# Patient Record
Sex: Male | Born: 1995 | Race: White | Hispanic: No | Marital: Single | Smoking: Never smoker
Health system: Southern US, Community
[De-identification: ages and names within clinical notes are randomized; demographics above are authoritative.]

## PROBLEM LIST (undated history)

## (undated) DIAGNOSIS — J302 Other seasonal allergic rhinitis: Secondary | ICD-10-CM

## (undated) DIAGNOSIS — I493 Ventricular premature depolarization: Secondary | ICD-10-CM

## (undated) HISTORY — PX: CIRCUMCISION: SUR203

---
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
## 2016-11-09 ENCOUNTER — Emergency Department (INDEPENDENT_AMBULATORY_CARE_PROVIDER_SITE_OTHER)
Admission: EM | Admit: 2016-11-09 | Discharge: 2016-11-09 | Disposition: A | Discharge status: Home / Self Care | Payer: BLUE CROSS/BLUE SHIELD | Source: Home / Self Care | Attending: Family Medicine | Admitting: Family Medicine

## 2016-11-09 DIAGNOSIS — J039 Acute tonsillitis, unspecified: Secondary | ICD-10-CM | POA: Diagnosis not present

## 2016-11-09 HISTORY — DX: Ventricular premature depolarization: I49.3

## 2016-11-09 LAB — POCT INFLUENZA A/B
INFLUENZA B, POC: NEGATIVE
Influenza A, POC: NEGATIVE

## 2016-11-09 LAB — POCT RAPID STREP A (OFFICE): Rapid Strep A Screen: NEGATIVE

## 2016-11-09 MED ORDER — METHYLPREDNISOLONE SODIUM SUCC 125 MG IJ SOLR
80.0000 mg | Freq: Once | INTRAMUSCULAR | Status: AC
  Administered 2016-11-09: 80 mg via INTRAMUSCULAR

## 2016-11-09 MED ORDER — CLINDAMYCIN HCL 300 MG PO CAPS: 300.0000 mg | ORAL_CAPSULE | Freq: Three times a day (TID) | ORAL | 0 refills | Status: AC

## 2016-11-09 MED ORDER — PREDNISONE 20 MG PO TABS: ORAL_TABLET | ORAL | 0 refills | Status: AC

## 2016-11-09 NOTE — ED Triage Notes (Signed)
Started with a sore throat Tuesday.  Started spitting up blood today.  Throat red and bloody

## 2016-11-09 NOTE — ED Provider Notes (Signed)
[facility]    CSN: [id number] Arrival date & time: 11/09/16  1657     History   Chief Complaint Chief Complaint  Patient presents with  . Sore Throat    HPI Jim Taylor is a 20 y.o. male.   Patient awoke with a sore throat two days ago.  He has had minimal nasal congestion and cough.  His throat has become increasingly painful and he has coughed up blood occasionally.  No fevers, chills, and sweats.  Minimal fatigue.   The history is provided by the patient and a parent.    Past Medical History:  Diagnosis Date  . 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 Seasonal allergies     Patient Active Problem List   Diagnosis Date Noted  . Syncope 07/21/2013  . Contusion of coccyx 10/13/2011  . ALLERGIC RHINITIS 05/12/2011    Past Surgical History:  Procedure Laterality Date  . CIRCUMCISION  1998       Home Medications    Prior to Admission medications   Medication Sig Start Date End Date Taking? Authorizing Provider  cetirizine (ZYRTEC) 10 MG tablet Take 10 mg by mouth daily as needed for allergies.    Historical Provider, MD  clindamycin (CLEOCIN) 300 MG capsule Take 1 capsule (300 mg total) by mouth 3 (three) times daily. 11/09/16   Lattie HawStephen A Lashaye Fisk, MD  dextromethorphan-guaiFENesin [facility](MUCINEX DM) 30-600 MG per 12 hr tablet Take 1 tablet by mouth 2 (two) times daily. 03/22/15   Vanetta MuldersScott Zackowski, MD  ibuprofen (ADVIL,MOTRIN) 800 MG tablet Take 1 tablet (800 mg total) by mouth 3 (three) times daily. 01/07/15   Elson AreasLeslie K Sofia, PA-C  predniSONE (DELTASONE) 20 MG tablet Take one tab by mouth twice daily for 4 days, then one daily for 3 days. Take with food. 11/09/16   Lattie HawStephen A Aleane Wesenberg, MD  PSEUDOEPHEDRINE HCL PO Take by mouth daily as needed.    Historical Provider, MD    Family History Family History  Problem Relation Age of Onset  . Lung cancer Maternal Grandfather     Died at 10782  . Diabetes Paternal Grandfather     Died in his  8170's  . Dementia Paternal Grandfather     Social History Social History  Substance Use Topics  . Smoking status: Never Smoker  . Smokeless tobacco: Never Used  . Alcohol use No     Allergies   Patient has no known allergies.   Review of Systems Review of Systems + sore throat + occasional cough No pleuritic pain No wheezing No nasal congestion ? post-nasal drainage No sinus pain/pressure No itchy/red eyes ? earache + hemoptysis No SOB No fever/chills No nausea No vomiting No abdominal pain No diarrhea No urinary symptoms No skin rash + fatigue No myalgias No headache Used OTC meds without relief   Physical Exam Triage Vital Signs ED Triage Vitals  Enc Vitals Group     BP 11/09/16 1723 163/76     Pulse Rate 11/09/16 1723 81     Resp --      Temp 11/09/16 1723 98.3 F (36.8 C)     Temp Source 11/09/16 1723 Oral     SpO2 11/09/16 1723 98 %     Weight 11/09/16 1724 196 lb 12.8 oz (89.3 kg)     Height 11/09/16 1724 6\' 2"  (1.88 m)     Head Circumference --      Peak Flow --  Pain Score 11/09/16 1725 8     Pain Loc --      Pain Edu? --      Excl. in GC? --    No data found.   Updated Vital Signs BP 163/76 (BP Location: Left Arm)   Pulse 81   Temp 98.3 F (36.8 C) (Oral)   Ht 6\' 2"  (1.88 m)   Wt 196 lb 12.8 oz (89.3 kg)   SpO2 98%   BMI 25.27 kg/m   Visual Acuity Right Eye Distance:   Left Eye Distance:   Bilateral Distance:    Right Eye Near:   Left Eye Near:    Bilateral Near:     Physical Exam Nursing notes and Vital Signs reviewed. Appearance:  Patient appears stated age, and in no acute distress Eyes:  Pupils are equal, round, and reactive to light and accomodation.  Extraocular movement is intact.  Conjunctivae are not inflamed  Ears:  Canals normal.  Tympanic membranes normal.  Nose:  Mildly congested turbinates.  No sinus tenderness.   Pharynx:  Erythematous and moderately swollen tonsils without obstruction.  No exudate.   Right tonsil slightly larger than left. Neck:  Supple.  Posterior/lateral nodes mildly enlarged with minimal tenderness. Lungs:  Clear to auscultation.  Breath sounds are equal.  Moving air well. Heart:  Regular rate and rhythm without murmurs, rubs, or gallops.  Abdomen:  Nontender without masses or hepatosplenomegaly.  Bowel sounds are present.  No CVA or flank tenderness.  Extremities:  No edema.  Skin:  No rash present.    UC Treatments / Results  Labs (all labs ordered are listed, but only abnormal results are displayed) Labs Reviewed  STREP A DNA PROBE  POCT RAPID STREP A (OFFICE) negative  POCT INFLUENZA A/B negative    EKG  EKG Interpretation None       Radiology No results found.  Procedures Procedures (including critical care time)  Medications Ordered in UC Medications  methylPREDNISolone sodium succinate (SOLU-MEDROL) 125 mg/2 mL injection 80 mg (80 mg Intramuscular Given 11/09/16 1749)     Initial Impression / Assessment and Plan / UC Course  I have reviewed the triage vital signs and the nursing notes.  Pertinent labs & imaging results that were available during my care of the patient were reviewed by me and considered in my medical decision making (see chart for details).  Clinical Course   Throat culture pending. Administered Solumedrol 80mg  IM Begin Clindamycin for possible fusobacter coverage. Begin prednisone burst/taper on Friday 11/10/16. Try warm salt water gargles for sore throat.  May take Tylenol if needed for pain. If symptoms become significantly worse during the night or over the weekend, proceed to the local emergency room.    Followup with Family Doctor if not improved in one week.             Final Clinical Impressions(s) / UC Diagnoses   Final diagnoses:  Tonsillitis    New Prescriptions New Prescriptions   CLINDAMYCIN (CLEOCIN) 300 MG CAPSULE    Take 1 capsule (300 mg total) by mouth 3 (three) times daily.   PREDNISONE  (DELTASONE) 20 MG TABLET    Take one tab by mouth twice daily for 4 days, then one daily for 3 days. Take with food.     Lattie HawStephen A Theoplis Garciagarcia, MD 11/09/16 [phone]

## 2016-11-09 NOTE — Discharge Instructions (Signed)
Begin prednisone on Friday 11/10/16. Try warm salt water gargles for sore throat.  May take Tylenol if needed for pain. If symptoms become significantly worse during the night or over the weekend, proceed to the local emergency room.

## 2016-11-10 ENCOUNTER — Telehealth: Payer: Self-pay | Admitting: *Deleted

## 2016-11-10 LAB — STREP A DNA PROBE: GASP: NOT DETECTED

## 2016-11-10 NOTE — Telephone Encounter (Signed)
LM with Tcx results, complete meds and call back if he has any questions or concerns.
